# Patient Record
Sex: Female | Born: 2000 | Race: Black or African American | Hispanic: No | Marital: Single | Smoking: Never smoker
Health system: Southern US, Community
[De-identification: ages and names within clinical notes are randomized; demographics above are authoritative.]

## PROBLEM LIST (undated history)

## (undated) ENCOUNTER — Inpatient Hospital Stay: Payer: Self-pay

## (undated) DIAGNOSIS — F329 Major depressive disorder, single episode, unspecified: Secondary | ICD-10-CM

## (undated) DIAGNOSIS — D573 Sickle-cell trait: Secondary | ICD-10-CM

## (undated) DIAGNOSIS — Z789 Other specified health status: Secondary | ICD-10-CM

## (undated) DIAGNOSIS — F32A Depression, unspecified: Secondary | ICD-10-CM

## (undated) HISTORY — DX: Sickle-cell trait: D57.3

## (undated) HISTORY — DX: Depression, unspecified: F32.A

---
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
## 2018-04-20 ENCOUNTER — Emergency Department: Payer: Medicaid Other

## 2018-04-20 ENCOUNTER — Other Ambulatory Visit: Payer: Self-pay

## 2018-04-20 ENCOUNTER — Encounter: Payer: Self-pay | Admitting: Emergency Medicine

## 2018-04-20 ENCOUNTER — Emergency Department
Admission: EM | Admit: 2018-04-20 | Discharge: 2018-04-20 | Disposition: A | Discharge status: Home / Self Care | Payer: Medicaid Other | Attending: Emergency Medicine | Admitting: Emergency Medicine

## 2018-04-20 DIAGNOSIS — M545 Low back pain: Secondary | ICD-10-CM | POA: Diagnosis present

## 2018-04-20 DIAGNOSIS — M5442 Lumbago with sciatica, left side: Secondary | ICD-10-CM | POA: Diagnosis not present

## 2018-04-20 DIAGNOSIS — Z79899 Other long term (current) drug therapy: Secondary | ICD-10-CM | POA: Diagnosis not present

## 2018-04-20 LAB — URINALYSIS, COMPLETE (UACMP) WITH MICROSCOPIC
BILIRUBIN URINE: NEGATIVE
Bacteria, UA: NONE SEEN
GLUCOSE, UA: NEGATIVE mg/dL
Hgb urine dipstick: NEGATIVE
KETONES UR: NEGATIVE mg/dL
NITRITE: NEGATIVE
PH: 6 (ref 5.0–8.0)
Protein, ur: NEGATIVE mg/dL
SPECIFIC GRAVITY, URINE: 1.011 (ref 1.005–1.030)

## 2018-04-20 LAB — POCT PREGNANCY, URINE: Preg Test, Ur: NEGATIVE

## 2018-04-20 MED ORDER — BACLOFEN 10 MG PO TABS: 10.0000 mg | ORAL_TABLET | Freq: Three times a day (TID) | ORAL | 1 refills | Status: AC

## 2018-04-20 MED ORDER — MELOXICAM 15 MG PO TABS: 15.0000 mg | ORAL_TABLET | Freq: Every day | ORAL | 2 refills | Status: DC

## 2018-04-20 NOTE — Discharge Instructions (Addendum)
Follow-up with your regular doctor or Dr. Martha Clan if not better in 3 to 5 days.  Return emergency department worsening.  Use medications as prescribed.  Apply ice to the lower back to decrease inflammation.

## 2018-04-20 NOTE — ED Triage Notes (Signed)
Lower back pain x2 weeks radiating to left buttock/ left leg.

## 2018-04-20 NOTE — ED Notes (Signed)
See triage note  Presents with lower back pain   States pain started about 2 weeks ago w/o known injury  Then states she fell down steps yesterday  Having pain to lower back and pain is moving into left leg

## 2018-04-20 NOTE — ED Notes (Signed)
First Nurse Note: Patient complaining of back pain with numbness into left leg X 2 weeks.

## 2018-04-20 NOTE — ED Provider Notes (Signed)
[facility] Emergency Department Provider Note  ____________________________________________   First MD Initiated Contact with Patient 04/20/18 1153     (approximate)  I have reviewed the triage vital signs and the nursing notes.   HISTORY  Chief Complaint Back Pain    HPI Abigail Cortez is a 18 y.o. female  C/o low back pain for 14 day, possible known injury, patient states that she did a lot of bending and lifting due to her daughter's birthday party, pain is worse with movement, increased with bending over, denies numbness, tingling, or changes in bowel/urinary habits,  Using otc meds without relief Remainder ros neg, she is also worried about a's swollen area at the lower part of her back.   Past Medical History:  Diagnosis Date  . Medical history non-contributory   . Sickle-cell trait [facility])     Patient Active Problem List   Diagnosis Date Noted  . Pyelonephritis, acute 11/23/2017  . Uterine contractions during pregnancy 04/11/2017  . Irregular uterine contractions 03/07/2017  . Labor and delivery, indication for care 07/19/2015  . Indication for care in labor and delivery, antepartum 05/03/2015    History reviewed. No pertinent surgical history.  Prior to Admission medications   Medication Sig Start Date End Date Taking? Authorizing Provider  baclofen (LIORESAL) 10 MG tablet Take 1 tablet (10 mg total) by mouth 3 (three) times daily. 04/20/18 04/20/19  France Noyce, Roselyn Bering, PA-C  meloxicam (MOBIC) 15 MG tablet Take 1 tablet (15 mg total) by mouth daily. 04/20/18 04/20/19  Sherrie Mustache Roselyn Bering, PA-C  sertraline (ZOLOFT) 25 MG tablet Take 25 mg by mouth daily.    [provider]    Allergies Patient has no known allergies.  No family history on file.  Social History Social History   Tobacco Use  . Smoking status: Never Smoker  . Smokeless tobacco: Never Used  Substance Use Topics  . Alcohol use: No  . Drug use: No    Review of  Systems  Constitutional: No fever/chills Eyes: No visual changes. ENT: No sore throat. Respiratory: Denies cough Genitourinary: Negative for dysuria. Musculoskeletal: Positive for back pain. Skin: Negative for rash.    ____________________________________________   PHYSICAL EXAM:  VITAL SIGNS: ED Triage Vitals [04/20/18 1033]  Enc Vitals Group     BP 114/66     Pulse Rate 98     Resp 18     Temp 98.3 F (36.8 C)     Temp Source Oral     SpO2 99 %     Weight 246 lb (111.6 kg)     Height 5\' 3"  (1.6 m)     Head Circumference      Peak Flow      Pain Score 10     Pain Loc      Pain Edu?      Excl. in GC?     Constitutional: Alert and oriented. Well appearing and in no acute distress. Eyes: Conjunctivae are normal.  Head: Atraumatic. Nose: No congestion/rhinnorhea. Mouth/Throat: Mucous membranes are moist.   Neck:  supple no lymphadenopathy noted Cardiovascular: Normal rate, regular rhythm. Heart sounds are normal Respiratory: Normal respiratory effort.  No retractions, lungs c t a  GU: deferred Musculoskeletal: FROM all extremities, warm and well perfused.  Decreased rom of back due to discomfort, lumbar spine mildly tender, a soft tissue light lipoma is noted at the base of the lumbar spine, negative Slr, full strength in great toes b/l, full strength in lower  legs, n/v intact, patient is able to walk without difficulty Neurologic:  Normal speech and language.  Skin:  Skin is warm, dry and intact. No rash noted. Psychiatric: Mood and affect are normal. Speech and behavior are normal.  ____________________________________________   LABS (all labs ordered are listed, but only abnormal results are displayed)  Labs Reviewed  URINALYSIS, COMPLETE (UACMP) WITH MICROSCOPIC - Abnormal; Notable for the following components:      Result Value   Color, Urine YELLOW (*)    APPearance CLEAR (*)    Leukocytes,Ua TRACE (*)    All other components within normal limits  POC  URINE PREG, ED  POCT PREGNANCY, URINE   ____________________________________________   ____________________________________________  RADIOLOGY  X-ray lumbar spine is negative for any acute abnormality  ____________________________________________   PROCEDURES  Procedure(s) performed: No  Procedures    ____________________________________________   INITIAL IMPRESSION / ASSESSMENT AND PLAN / ED COURSE  Pertinent labs & imaging results that were available during my care of the patient were reviewed by me and considered in my medical decision making (see chart for details).   Patient is 18 year old female presents emergency department complaint of low back pain.  Physical exam shows some soft tissue swelling noted at the lumbar spine, area is tender to palpation  X-ray of the lumbar spine is negative  Explained the x-ray results to the patient.  She is to follow-up with regular doctor if not better in 3 to 5 days.  She was given prescription for meloxicam and baclofen.  She is apply ice to the lower back.  She is to return if worsening.  She states she understands will comply.  She is discharged stable condition.     As part of my medical decision making, I reviewed the following data within the electronic MEDICAL RECORD NUMBER Nursing notes reviewed and incorporated, Old chart reviewed, Radiograph reviewed x-ray lumbar spine is negative, Notes from prior ED visits and [place] Controlled Substance Database  ____________________________________________   FINAL CLINICAL IMPRESSION(S) / ED DIAGNOSES  Final diagnoses:  Acute midline low back pain with left-sided sciatica      NEW MEDICATIONS STARTED DURING THIS VISIT:  New Prescriptions   BACLOFEN (LIORESAL) 10 MG TABLET    Take 1 tablet (10 mg total) by mouth 3 (three) times daily.   MELOXICAM (MOBIC) 15 MG TABLET    Take 1 tablet (15 mg total) by mouth daily.     Note:  This document was prepared using Dragon voice  recognition software and may include unintentional dictation errors.     Faythe Ghee, PA-C 04/20/18 1514    Rockne Menghini, MD 04/20/18 [phone]

## 2018-07-17 IMAGING — CT CT MAXILLOFACIAL W/O CM
4 of 8 series · 16 of 47 positions shown, 19 images · non-contrast
Comparison: None.

CLINICAL DATA: Patient fell down steps 2 days ago presents with
headache. Tenderness to left side of face.

EXAM:
CT HEAD WITHOUT CONTRAST
CT MAXILLOFACIAL WITHOUT CONTRAST
TECHNIQUE: Multidetector CT imaging of the head and maxillofacial structures
were performed using the standard protocol without intravenous
contrast. Multiplanar CT image reconstructions of the maxillofacial
structures were also generated.

[Series 3: head bone · axial · 0.41mm/px · z∈[+202,+314]mm · 10 of 70 slices shown, 13 images]
[im 7/70  brain]
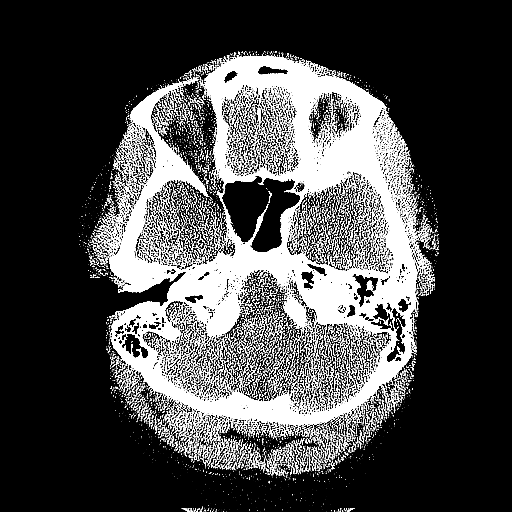
[im 7/70  bone]
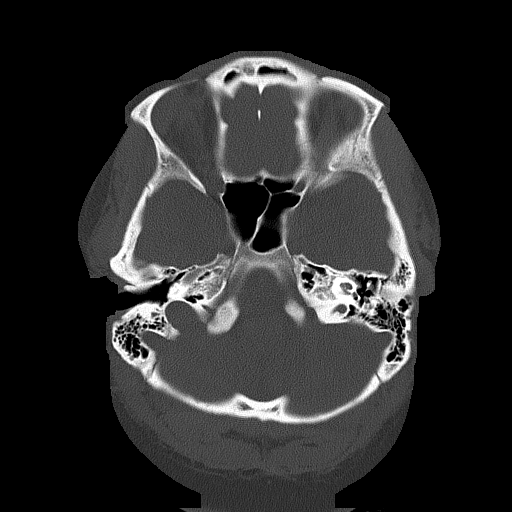
[im 13/70  bone]
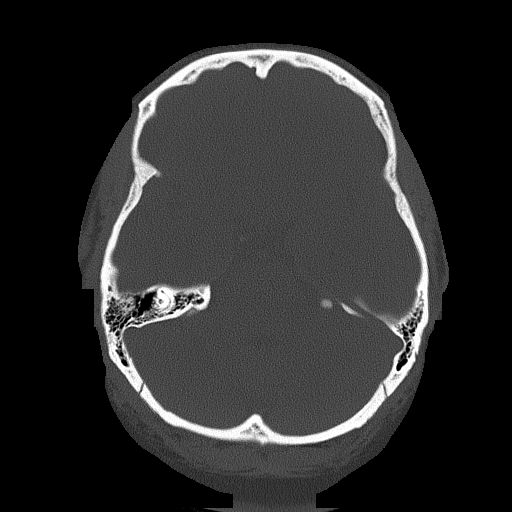
[im 19/70  bone]
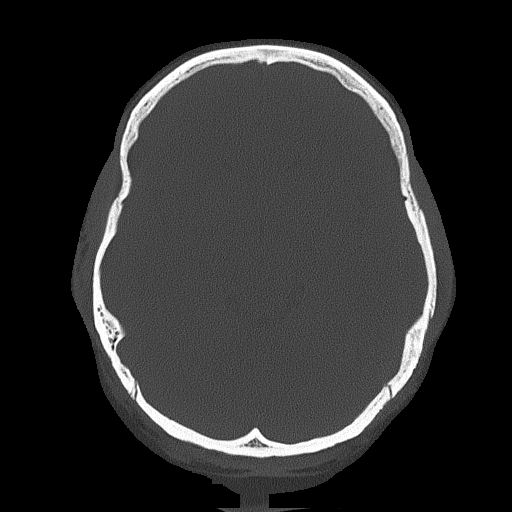
[im 26/70  bone]
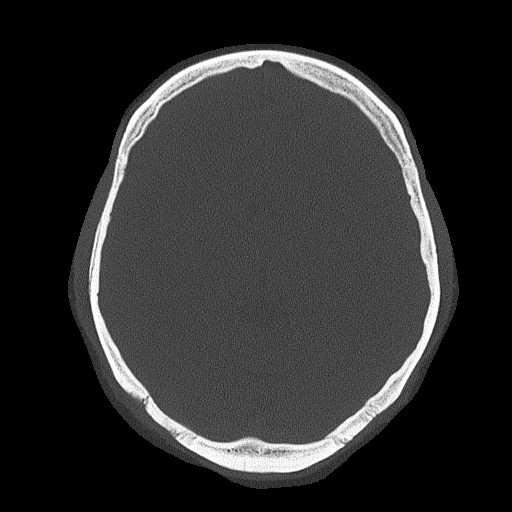
[im 32/70  brain]
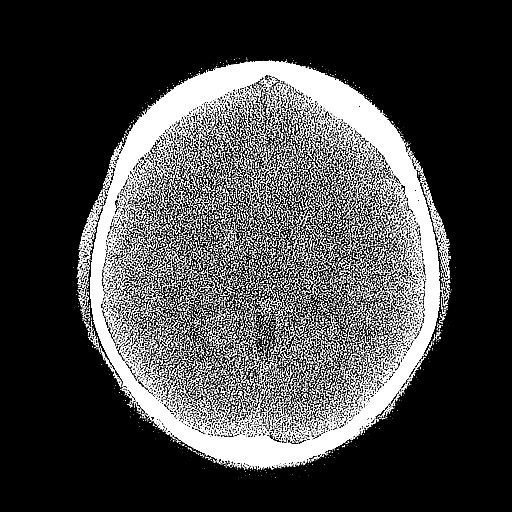
[im 32/70  bone]
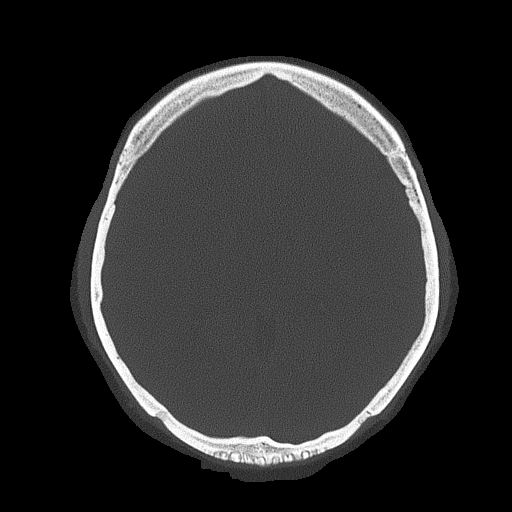
[im 38/70  bone]
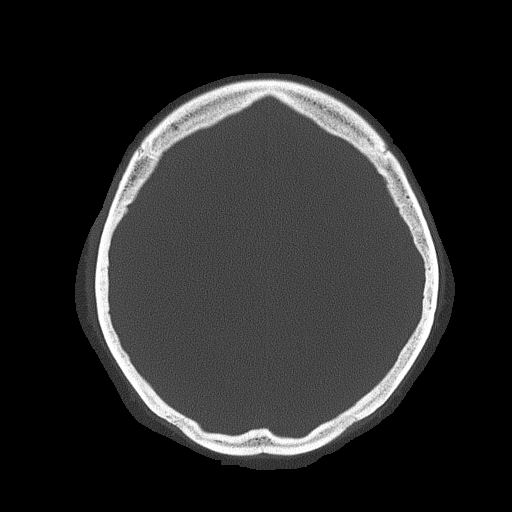
[im 44/70  bone]
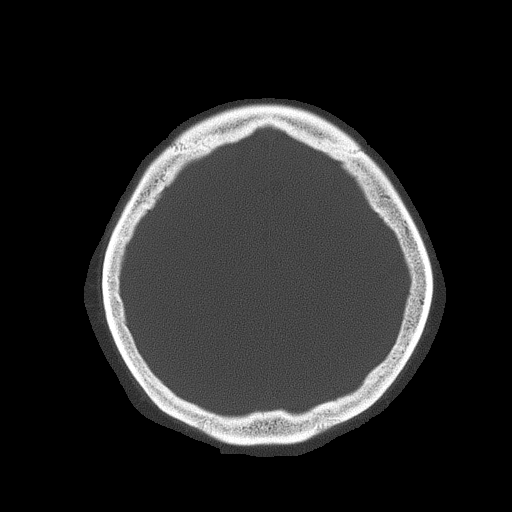
[im 51/70  bone]
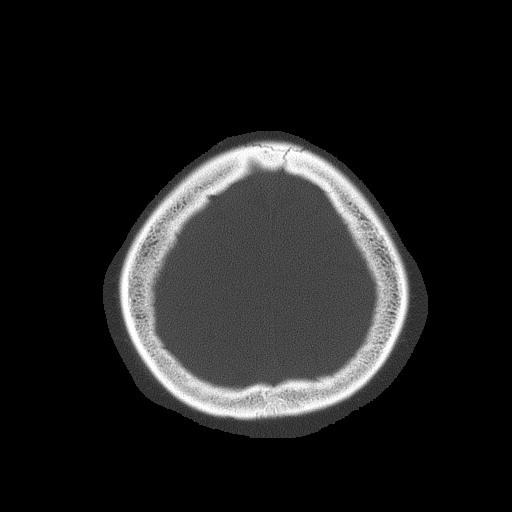
[im 57/70  brain]
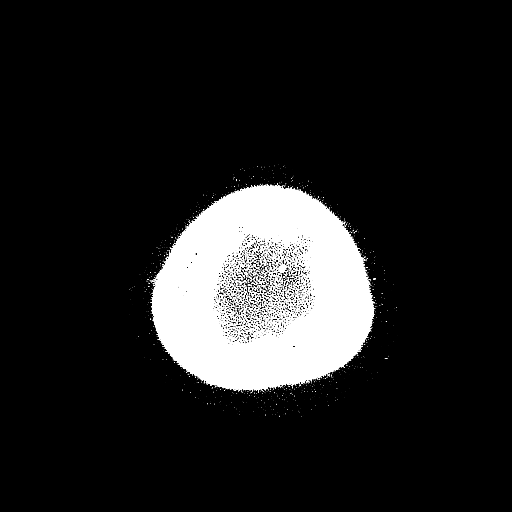
[im 57/70  bone]
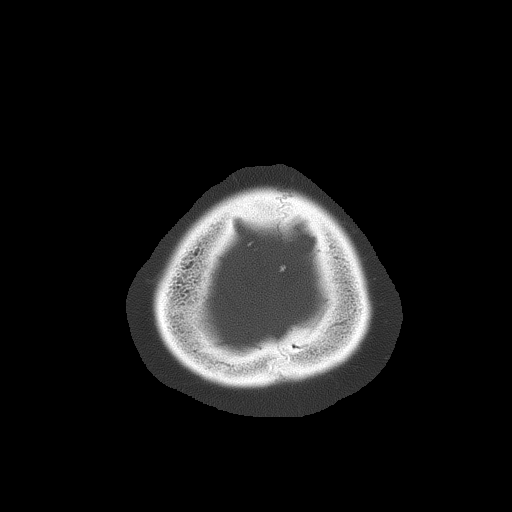
[im 63/70  bone]
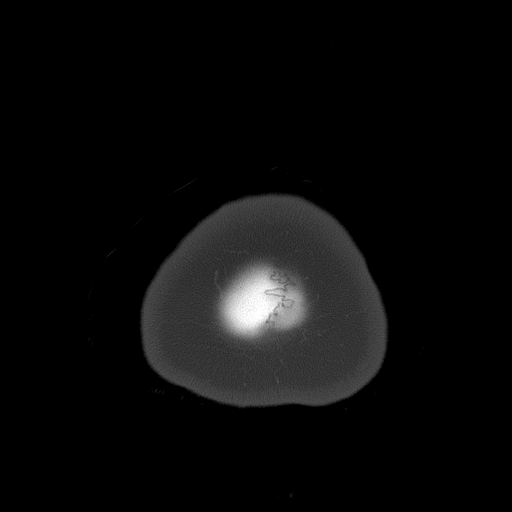

[Series 6: max soft · axial · 0.35mm/px · z∈[+92,+116]mm · 2 of 73 slices shown]
[im 7/73  brain]
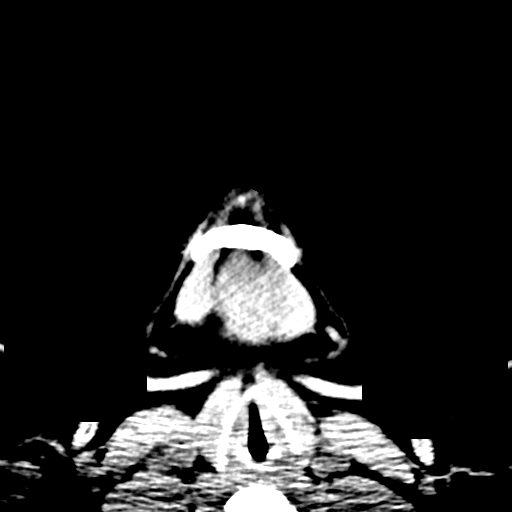
[im 19/73  brain]
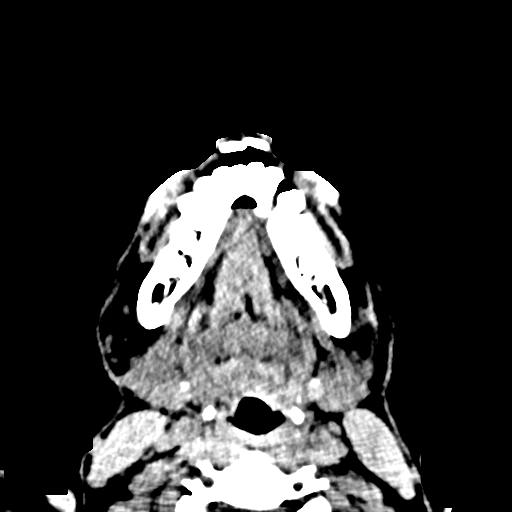

[Series 10: coronal soft · coronal · 0.35mm/px · 3 of 75 slices shown]
[im 21/75  bone]
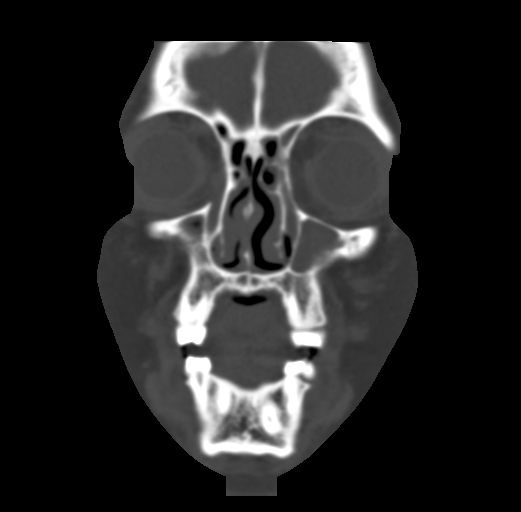
[im 34/75  bone]
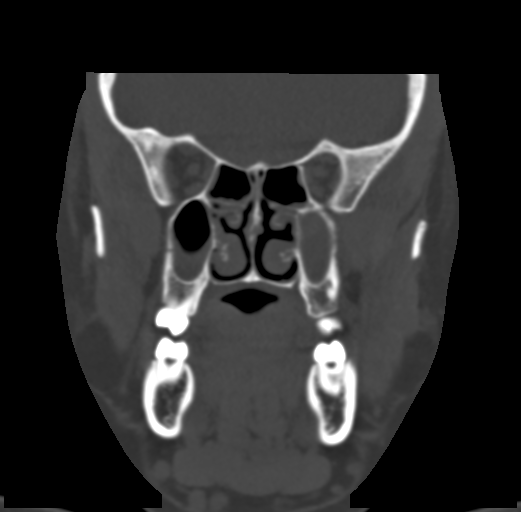
[im 48/75  bone]
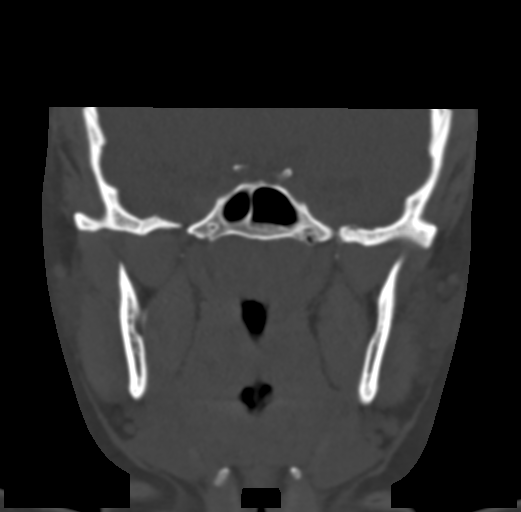

[Series 13: sagittal bone · sagittal · 0.35mm/px · 1 of 78 slices shown]
[im 39/78  bone]
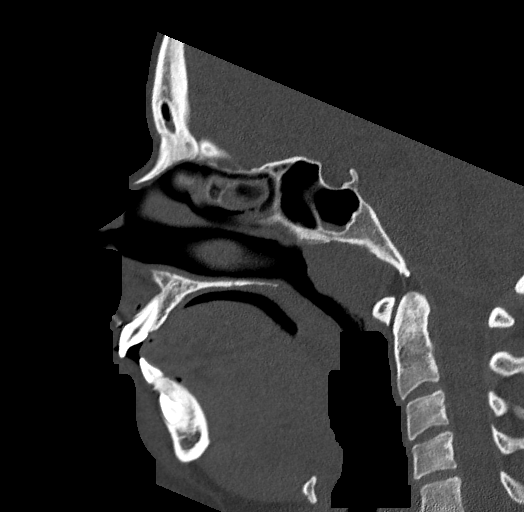

[16 of 47 positions shown; findings below may reference images not displayed]

FINDINGS: CT HEAD FINDINGS

BRAIN: The ventricles and sulci are normal. No intraparenchymal
hemorrhage, mass effect nor midline shift. No acute large vascular
territory infarcts. No abnormal extra-axial fluid collections. Basal
cisterns are patent.

VASCULAR: Unremarkable.

SKULL/SOFT TISSUES: No skull fracture. No significant soft tissue
swelling.

OTHER: None.

CT MAXILLOFACIAL FINDINGS

OSSEOUS: The mandible is intact, the condyles are located. No acute
facial fracture. No destructive bony lesions.

ORBITS: Ocular globes and orbital contents are normal.

SINUSES: Moderate paranasal sinus mucosal thickening of the included
maxillary sinuses and mild-to-moderate ethmoid sinus mucosal
thickening. Mild frontal sinus mucosal thickening is also noted. The
sphenoid sinus is clear. No air-fluid levels are noted. Nasal septum
is midline. Included mastoid air cells are well aerated.

SOFT TISSUES: No significant soft tissue swelling. No subcutaneous
gas or radiopaque foreign bodies.
IMPRESSION: 1. Normal CT head.
2. No acute maxillofacial fracture.  Chronic paranasal sinusitis.

## 2018-09-28 ENCOUNTER — Ambulatory Visit: Payer: Medicaid Other

## 2018-10-18 ENCOUNTER — Other Ambulatory Visit: Payer: Self-pay

## 2018-10-18 ENCOUNTER — Emergency Department
Admission: EM | Admit: 2018-10-18 | Discharge: 2018-10-18 | Disposition: A | Discharge status: Home / Self Care | Payer: Medicaid Other | Attending: Student in an Organized Health Care Education/Training Program | Admitting: Student in an Organized Health Care Education/Training Program

## 2018-10-18 ENCOUNTER — Emergency Department: Payer: Medicaid Other

## 2018-10-18 DIAGNOSIS — Z20828 Contact with and (suspected) exposure to other viral communicable diseases: Secondary | ICD-10-CM | POA: Insufficient documentation

## 2018-10-18 DIAGNOSIS — B349 Viral infection, unspecified: Secondary | ICD-10-CM | POA: Diagnosis not present

## 2018-10-18 DIAGNOSIS — J029 Acute pharyngitis, unspecified: Secondary | ICD-10-CM

## 2018-10-18 DIAGNOSIS — Z79899 Other long term (current) drug therapy: Secondary | ICD-10-CM | POA: Diagnosis not present

## 2018-10-18 LAB — BASIC METABOLIC PANEL
Anion gap: 9 (ref 5–15)
BUN: 10 mg/dL (ref 6–20)
CO2: 23 mmol/L (ref 22–32)
Calcium: 9 mg/dL (ref 8.9–10.3)
Chloride: 106 mmol/L (ref 98–111)
Creatinine, Ser: 0.85 mg/dL (ref 0.44–1.00)
GFR calc Af Amer: >60 mL/min (ref >60)
GFR calc non Af Amer: >60 mL/min (ref >60)
Glucose, Bld: 103 mg/dL — ABNORMAL HIGH (ref 70–99)
Potassium: 3.5 mmol/L (ref 3.5–5.1)
Sodium: 138 mmol/L (ref 135–145)

## 2018-10-18 LAB — CBC WITH DIFFERENTIAL/PLATELET
Abs Immature Granulocytes: 0.02 10*3/uL (ref 0.00–0.07)
Basophils Absolute: 0 10*3/uL (ref 0.0–0.1)
Basophils Relative: 0 %
Eosinophils Absolute: 0.2 10*3/uL (ref 0.0–0.5)
Eosinophils Relative: 3 %
HCT: 37.4 % (ref 36.0–46.0)
Hemoglobin: 12.4 g/dL (ref 12.0–15.0)
Immature Granulocytes: 0 %
Lymphocytes Relative: 30 %
Lymphs Abs: 2.1 10*3/uL (ref 0.7–4.0)
MCH: 25.6 pg — ABNORMAL LOW (ref 26.0–34.0)
MCHC: 33.2 g/dL (ref 30.0–36.0)
MCV: 77.1 fL — ABNORMAL LOW (ref 80.0–100.0)
Monocytes Absolute: 0.6 10*3/uL (ref 0.1–1.0)
Monocytes Relative: 9 %
Neutro Abs: 3.9 10*3/uL (ref 1.7–7.7)
Neutrophils Relative %: 58 %
Platelets: 316 10*3/uL (ref 150–400)
RBC: 4.85 MIL/uL (ref 3.87–5.11)
RDW: 15 % (ref 11.5–15.5)
WBC: 6.8 10*3/uL (ref 4.0–10.5)
nRBC: 0 % (ref 0.0–0.2)

## 2018-10-18 LAB — GROUP A STREP BY PCR: Group A Strep by PCR: NOT DETECTED

## 2018-10-18 LAB — SARS CORONAVIRUS 2 (TAT 6-24 HRS): SARS Coronavirus 2: NEGATIVE

## 2018-10-18 MED ORDER — METHYLPREDNISOLONE 4 MG PO TBPK: ORAL_TABLET | ORAL | 0 refills | Status: DC

## 2018-10-18 MED ORDER — KETOROLAC TROMETHAMINE 30 MG/ML IJ SOLN
30.0000 mg | Freq: Once | INTRAMUSCULAR | Status: AC
  Administered 2018-10-18: 09:00:00 30 mg via INTRAVENOUS
  Filled 2018-10-18: qty 1

## 2018-10-18 MED ORDER — METHYLPREDNISOLONE SODIUM SUCC 125 MG IJ SOLR
125.0000 mg | Freq: Once | INTRAMUSCULAR | Status: AC
  Administered 2018-10-18: 09:00:00 125 mg via INTRAVENOUS
  Filled 2018-10-18: qty 2

## 2018-10-18 MED ORDER — LIDOCAINE VISCOUS HCL 2 % MT SOLN: 5.0000 mL | Freq: Four times a day (QID) | OROMUCOSAL | 0 refills | Status: DC | PRN

## 2018-10-18 NOTE — ED Notes (Signed)
Pt provided with phone to call POC.

## 2018-10-18 NOTE — ED Notes (Signed)
Patient transported to X-ray 

## 2018-10-18 NOTE — ED Provider Notes (Signed)
[facility] Emergency Department Provider Note   ____________________________________________   First MD Initiated Contact with Patient 10/18/18 [phone]     (approximate)  I have reviewed the triage vital signs and the nursing notes.   HISTORY  Chief Complaint Dental Pain, Sore Throat, and Generalized Body Aches    HPI Abigail Cortez is a 18 y.o. female patient presents with acute onset of sore throat, mouth pain, and body aches.  Patient states unable to open mouth secondary to swelling.  Onset of complaint was 1 day ago.  Patient denies provocative incident for complaint.  Patient denies fever/chills, nasal congestion, or headache.  Patient rates the pain as a 10/10.  No palliative measure for complaint.         Past Medical History:  Diagnosis Date  . Depression   . Medical history non-contributory   . Sickle-cell trait [facility])     Patient Active Problem List   Diagnosis Date Noted  . Pyelonephritis, acute 11/23/2017  . Irregular uterine contractions 03/07/2017    No past surgical history on file.  Prior to Admission medications   Medication Sig Start Date End Date Taking? Authorizing Provider  baclofen (LIORESAL) 10 MG tablet Take 1 tablet (10 mg total) by mouth 3 (three) times daily. 04/20/18 04/20/19  Fisher, Linden Dolin, PA-C  lidocaine (XYLOCAINE) 2 % solution Use as directed 5 mLs in the mouth or throat every 6 (six) hours as needed for mouth pain. Swish and swallow. 10/18/18   Sable Feil, PA-C  meloxicam (MOBIC) 15 MG tablet Take 1 tablet (15 mg total) by mouth daily. 04/20/18 04/20/19  Caryn Section Linden Dolin, PA-C  methylPREDNISolone (MEDROL DOSEPAK) 4 MG TBPK tablet Take Tapered dose as directed 10/18/18   Sable Feil, PA-C  sertraline (ZOLOFT) 25 MG tablet Take 25 mg by mouth daily.    [provider]    Allergies Patient has no known allergies.  Family History  Problem Relation Age of Onset  . Anemia Mother   . Asthma Mother    . Seizures Mother   . Bipolar disorder Mother   . Hypertension Mother   . Hepatitis Mother   . Hypertension Maternal Grandmother   . Diabetes Maternal Grandmother   . Breast cancer Maternal Grandmother   . Bipolar disorder Maternal Grandmother   . Schizophrenia Maternal Grandmother   . Hypertension Maternal Grandfather   . Hypertension Paternal Grandmother   . Lung cancer Paternal Grandmother   . Diabetes Paternal Grandmother   . Migraines Paternal Grandmother   . Hypertension Paternal Grandfather     Social History Social History   Tobacco Use  . Smoking status: Never Smoker  . Smokeless tobacco: Never Used  Substance Use Topics  . Alcohol use: No  . Drug use: No    Review of Systems Constitutional: No fever/chills.  Myalgia Eyes: No visual changes. ENT: Sore throat. Cardiovascular: Denies chest pain. Respiratory: Denies shortness of breath. Gastrointestinal: No abdominal pain.  No nausea, no vomiting.  No diarrhea.  No constipation. Genitourinary: Negative for dysuria. Musculoskeletal: Negative for back pain. Skin: Negative for rash.  Mild facial edema Neurological: Negative for headaches, focal weakness or numbness. Hematological/Lymphatic:  Sickle cell trait  ____________________________________________   PHYSICAL EXAM:  VITAL SIGNS: ED Triage Vitals  Enc Vitals Group     BP 10/18/18 0814 128/74     Pulse Rate 10/18/18 0814 (!) 101     Resp 10/18/18 0814 20     Temp 10/18/18 0814 98.4  F (36.9 C)     Temp Source 10/18/18 0814 Oral     SpO2 10/18/18 0814 97 %     Weight 10/18/18 0815 220 lb (99.8 kg)     Height 10/18/18 0815 5\' 2"  (1.575 m)     Head Circumference --      Peak Flow --      Pain Score 10/18/18 0815 10     Pain Loc --      Pain Edu? --      Excl. in GC? --    Constitutional: Alert and oriented. Well appearing and in no acute distress. Nose: No congestion/rhinnorhea. Mouth/Throat: Mucous membranes are moist.  Oropharynx  non-erythematous. Neck: No stridor.  Hematological/Lymphatic/Immunilogical: No cervical lymphadenopathy. Cardiovascular: Normal rate, regular rhythm. Grossly normal heart sounds.  Good peripheral circulation. Respiratory: Normal respiratory effort.  No retractions. Lungs CTAB. Gastrointestinal: Soft and nontender. No distention. No abdominal bruits. No CVA tenderness. Genitourinary: Deferred Musculoskeletal: No lower extremity tenderness nor edema.  No joint effusions. Neurologic:  Normal speech and language. No gross focal neurologic deficits are appreciated. No gait instability. Skin:  Skin is warm, dry and intact. No rash noted. Psychiatric: Mood and affect are normal. Speech and behavior are normal.  ____________________________________________   LABS (all labs ordered are listed, but only abnormal results are displayed)  Labs Reviewed  BASIC METABOLIC PANEL - Abnormal; Notable for the following components:      Result Value   Glucose, Bld 103 (*)    All other components within normal limits  CBC WITH DIFFERENTIAL/PLATELET - Abnormal; Notable for the following components:   MCV 77.1 (*)    MCH 25.6 (*)    All other components within normal limits  GROUP A STREP BY PCR  SARS CORONAVIRUS 2   ____________________________________________  EKG   ____________________________________________  RADIOLOGY  ED MD interpretation:    Official radiology report(s): Dg Neck Soft Tissue  Result Date: 10/18/2018 CLINICAL DATA:  Sore throat and pain when swallowing since yesterday, no fever EXAM: NECK SOFT TISSUES - 1+ VIEW COMPARISON:  None. FINDINGS: There is no evidence of retropharyngeal soft tissue swelling or epiglottic enlargement. The cervical airway is unremarkable and no radio-opaque foreign body identified. IMPRESSION: Negative. Electronically Signed   By: Norva PavlovElizabeth  Brown M.D.   On: 10/18/2018 09:12    ____________________________________________   PROCEDURES   Procedure(s) performed (including Critical Care):  Procedures   ____________________________________________   INITIAL IMPRESSION / ASSESSMENT AND PLAN / ED COURSE  As part of my medical decision making, I reviewed the following data within the electronic MEDICAL RECORD NUMBER     Abigail Cortez was evaluated in Emergency Department on 10/18/2018 for the symptoms described in the history of present illness. She was evaluated in the context of the global COVID-19 pandemic, which necessitated consideration that the patient might be at risk for infection with the SARS-CoV-2 virus that causes COVID-19. Institutional protocols and algorithms that pertain to the evaluation of patients at risk for COVID-19 are in a state of rapid change based on information released by regulatory bodies including the CDC and federal and state organizations. These policies and algorithms were followed during the patient's care in the ED.  Patient presents with cute onset of sore throat mild facial edema.  Patient also complain of body aches.  Discussed negative lab results with patient.  Discussed negative soft tissue neck x-ray.  Physical exam was grossly unremarkable.  Patient state feeling better status post IV steroids.  Advised patient self quarantine  pending COVID-19 results.  Patient given discharge care instruction advised return to ED if condition worsens.          ____________________________________________   FINAL CLINICAL IMPRESSION(S) / ED DIAGNOSES  Final diagnoses:  Viral illness  Sore throat     ED Discharge Orders         Ordered    methylPREDNISolone (MEDROL DOSEPAK) 4 MG TBPK tablet     10/18/18 0939    lidocaine (XYLOCAINE) 2 % solution  Every 6 hours PRN     10/18/18 [id number]           Note:  This document was prepared using Dragon voice recognition software and may include unintentional dictation errors.    Joni ReiningSmith, Ronald K, PA-C 10/18/18 [id number]    Willy Eddyobinson, Patrick, MD  10/18/18 [phone]

## 2018-10-18 NOTE — ED Triage Notes (Signed)
Pt arrived via POV with reports of "everything hurts" Pt states mouth pain and sore throat. Pt mumbling in triage.   Pt denies any COVID contacts.

## 2018-10-18 NOTE — Discharge Instructions (Signed)
Advised self quarantine pending COVID-19 test results. °

## 2018-10-18 NOTE — ED Notes (Signed)
ED Provider at bedside. 

## 2019-04-05 ENCOUNTER — Emergency Department: Payer: Medicaid Other

## 2019-04-05 ENCOUNTER — Encounter: Payer: Self-pay | Admitting: Emergency Medicine

## 2019-04-05 ENCOUNTER — Emergency Department
Admission: EM | Admit: 2019-04-05 | Discharge: 2019-04-05 | Disposition: A | Discharge status: Home / Self Care | Payer: Medicaid Other | Attending: Emergency Medicine | Admitting: Emergency Medicine

## 2019-04-05 ENCOUNTER — Other Ambulatory Visit: Payer: Self-pay

## 2019-04-05 DIAGNOSIS — N12 Tubulo-interstitial nephritis, not specified as acute or chronic: Secondary | ICD-10-CM | POA: Diagnosis not present

## 2019-04-05 DIAGNOSIS — D573 Sickle-cell trait: Secondary | ICD-10-CM | POA: Diagnosis not present

## 2019-04-05 DIAGNOSIS — E860 Dehydration: Secondary | ICD-10-CM | POA: Diagnosis not present

## 2019-04-05 DIAGNOSIS — R112 Nausea with vomiting, unspecified: Secondary | ICD-10-CM | POA: Diagnosis present

## 2019-04-05 DIAGNOSIS — Z20822 Contact with and (suspected) exposure to covid-19: Secondary | ICD-10-CM | POA: Diagnosis not present

## 2019-04-05 LAB — COMPREHENSIVE METABOLIC PANEL
ALT: 16 U/L (ref 0–44)
AST: 33 U/L (ref 15–41)
Albumin: 3.6 g/dL (ref 3.5–5.0)
Alkaline Phosphatase: 65 U/L (ref 38–126)
Anion gap: 12 (ref 5–15)
BUN: 8 mg/dL (ref 6–20)
CO2: 24 mmol/L (ref 22–32)
Calcium: 8.7 mg/dL — ABNORMAL LOW (ref 8.9–10.3)
Chloride: 100 mmol/L (ref 98–111)
Creatinine, Ser: 1.08 mg/dL — ABNORMAL HIGH (ref 0.44–1.00)
GFR calc Af Amer: >60 mL/min (ref >60)
GFR calc non Af Amer: >60 mL/min (ref >60)
Glucose, Bld: 111 mg/dL — ABNORMAL HIGH (ref 70–99)
Potassium: 3 mmol/L — ABNORMAL LOW (ref 3.5–5.1)
Sodium: 136 mmol/L (ref 135–145)
Total Bilirubin: 1.8 mg/dL — ABNORMAL HIGH (ref 0.3–1.2)
Total Protein: 8.1 g/dL (ref 6.5–8.1)

## 2019-04-05 LAB — CBC WITH DIFFERENTIAL/PLATELET
Abs Immature Granulocytes: 0.07 10*3/uL (ref 0.00–0.07)
Basophils Absolute: 0 10*3/uL (ref 0.0–0.1)
Basophils Relative: 0 %
Eosinophils Absolute: 0 10*3/uL (ref 0.0–0.5)
Eosinophils Relative: 0 %
HCT: 35.6 % — ABNORMAL LOW (ref 36.0–46.0)
Hemoglobin: 11.9 g/dL — ABNORMAL LOW (ref 12.0–15.0)
Immature Granulocytes: 0 %
Lymphocytes Relative: 9 %
Lymphs Abs: 1.4 10*3/uL (ref 0.7–4.0)
MCH: 26.3 pg (ref 26.0–34.0)
MCHC: 33.4 g/dL (ref 30.0–36.0)
MCV: 78.8 fL — ABNORMAL LOW (ref 80.0–100.0)
Monocytes Absolute: 1.7 10*3/uL — ABNORMAL HIGH (ref 0.1–1.0)
Monocytes Relative: 11 %
Neutro Abs: 13 10*3/uL — ABNORMAL HIGH (ref 1.7–7.7)
Neutrophils Relative %: 80 %
Platelets: 231 10*3/uL (ref 150–400)
RBC: 4.52 MIL/uL (ref 3.87–5.11)
RDW: 13.7 % (ref 11.5–15.5)
WBC: 16.2 10*3/uL — ABNORMAL HIGH (ref 4.0–10.5)
nRBC: 0 % (ref 0.0–0.2)

## 2019-04-05 LAB — LACTIC ACID, PLASMA: Lactic Acid, Venous: 0.8 mmol/L (ref 0.5–1.9)

## 2019-04-05 LAB — URINALYSIS, COMPLETE (UACMP) WITH MICROSCOPIC
Bilirubin Urine: NEGATIVE
Glucose, UA: NEGATIVE mg/dL
Ketones, ur: NEGATIVE mg/dL
Nitrite: NEGATIVE
Protein, ur: 100 mg/dL — AB
Specific Gravity, Urine: 1.012 (ref 1.005–1.030)
WBC, UA: >50 WBC/hpf — ABNORMAL HIGH (ref 0–5)
pH: 6 (ref 5.0–8.0)

## 2019-04-05 LAB — RESPIRATORY PANEL BY RT PCR (FLU A&B, COVID)
Influenza A by PCR: NEGATIVE
Influenza B by PCR: NEGATIVE
SARS Coronavirus 2 by RT PCR: NEGATIVE

## 2019-04-05 LAB — LIPASE, BLOOD: Lipase: 20 U/L (ref 11–51)

## 2019-04-05 MED ORDER — SODIUM CHLORIDE 0.9 % IV SOLN
1.0000 g | Freq: Once | INTRAVENOUS | Status: AC
  Administered 2019-04-05: 1 g via INTRAVENOUS
  Filled 2019-04-05: qty 10

## 2019-04-05 MED ORDER — CIPROFLOXACIN HCL 500 MG PO TABS: 500.0000 mg | ORAL_TABLET | Freq: Two times a day (BID) | ORAL | 0 refills | Status: AC

## 2019-04-05 MED ORDER — ONDANSETRON 4 MG PO TBDP: 4.0000 mg | ORAL_TABLET | Freq: Three times a day (TID) | ORAL | 0 refills | Status: DC | PRN

## 2019-04-05 MED ORDER — ACETAMINOPHEN 500 MG PO TABS: 1000.0000 mg | ORAL_TABLET | Freq: Once | ORAL | Status: DC

## 2019-04-05 MED ORDER — ONDANSETRON HCL 4 MG/2ML IJ SOLN
4.0000 mg | Freq: Once | INTRAMUSCULAR | Status: AC
  Administered 2019-04-05: 20:00:00 4 mg via INTRAVENOUS
  Filled 2019-04-05: qty 2

## 2019-04-05 MED ORDER — SODIUM CHLORIDE 0.9% FLUSH
3.0000 mL | Freq: Once | INTRAVENOUS | Status: AC
  Administered 2019-04-05: 20:00:00 3 mL via INTRAVENOUS

## 2019-04-05 MED ORDER — SODIUM CHLORIDE 0.9 % IV SOLN: Freq: Once | INTRAVENOUS | Status: AC

## 2019-04-05 MED ORDER — ACETAMINOPHEN 500 MG PO TABS
1000.0000 mg | ORAL_TABLET | Freq: Once | ORAL | Status: AC
  Administered 2019-04-05: 1000 mg via ORAL
  Filled 2019-04-05: qty 2

## 2019-04-05 NOTE — ED Triage Notes (Signed)
Pt to ED from home c/o nausea and vomiting since Saturday, states vomited x2 today, denies diarrhea.  States got COVID results back Friday that were negative.  States generalized abd pain after throwing up.  States generally feels weak and light headed.  Pt A&Ox4, chest rise even and unlabored in triage.

## 2019-04-05 NOTE — ED Provider Notes (Addendum)
[facility] Emergency Department Provider Note       Time seen: ----------------------------------------- 7:46 PM on 04/05/2019 -----------------------------------------   I have reviewed the triage vital signs and the nursing notes.  HISTORY   Chief Complaint Nausea and Emesis    HPI Abigail Cortez is a 19 y.o. female with a history of depression, sickle cell trait who presents to the ED for nausea vomiting since Saturday.  Patient vomited twice today, denies any diarrhea.  Patient states she got Covid results back Friday and were negative.  She still has her taste and smell, has generalized abdominal pain after throwing up.  Feels weak and lightheaded.  Past Medical History:  Diagnosis Date  . Depression   . Medical history non-contributory   . Sickle-cell trait [facility])     Patient Active Problem List   Diagnosis Date Noted  . Pyelonephritis, acute 11/23/2017  . Irregular uterine contractions 03/07/2017    History reviewed. No pertinent surgical history.  Allergies Patient has no known allergies.  Social History Social History   Tobacco Use  . Smoking status: Never Smoker  . Smokeless tobacco: Never Used  Substance Use Topics  . Alcohol use: Yes  . Drug use: Yes    Types: Marijuana    Review of Systems Constitutional: Negative for fever. Cardiovascular: Negative for chest pain. Respiratory: Negative for shortness of breath. Gastrointestinal: Positive for abdominal pain, vomiting Musculoskeletal: Negative for back pain. Skin: Negative for rash. Neurological: Negative for headaches, focal weakness or numbness.  All systems negative/normal/unremarkable except as stated in the HPI  ____________________________________________   PHYSICAL EXAM:  VITAL SIGNS: ED Triage Vitals  Enc Vitals Group     BP 04/05/19 1912 119/82     Pulse Rate 04/05/19 1912 (!) 131     Resp 04/05/19 1912 20     Temp 04/05/19 1912 (!) 103 F (39.4 C)      Temp Source 04/05/19 1912 Oral     SpO2 04/05/19 1912 99 %     Weight 04/05/19 1917 235 lb (106.6 kg)     Height 04/05/19 1917 5\' 3"  (1.6 m)     Head Circumference --      Peak Flow --      Pain Score 04/05/19 1916 9     Pain Loc --      Pain Edu? --      Excl. in [place]? --     Constitutional: Alert and oriented. Well appearing and in no distress. Eyes: Conjunctivae are normal. Normal extraocular movements. Cardiovascular: Rapid rate, regular rhythm. No murmurs, rubs, or gallops. Respiratory: Normal respiratory effort without tachypnea nor retractions. Breath sounds are clear and equal bilaterally. No wheezes/rales/rhonchi. Gastrointestinal: Soft and nontender. Normal bowel sounds Musculoskeletal: Nontender with normal range of motion in extremities. No lower extremity tenderness nor edema. Neurologic:  Normal speech and language. No gross focal neurologic deficits are appreciated.  Skin:  Skin is warm, dry and intact. No rash noted. Psychiatric: Mood and affect are normal. Speech and behavior are normal.  ____________________________________________  EKG: Interpreted by me.  Sinus tachycardia with a rate of 133 bpm, normal PR interval, normal QRS, normal QT  ____________________________________________  ED COURSE:  As part of my medical decision making, I reviewed the following data within the [place] History obtained from family if available, nursing notes, old chart and ekg, as well as notes from prior ED visits. Patient presented for abdominal pain and vomiting, we will assess with labs and imaging  as indicated at this time.   Procedures  Cree Napoli was evaluated in Emergency Department on 04/05/2019 for the symptoms described in the history of present illness. She was evaluated in the context of the global COVID-19 pandemic, which necessitated consideration that the patient might be at risk for infection with the SARS-CoV-2 virus that causes COVID-19.  Institutional protocols and algorithms that pertain to the evaluation of patients at risk for COVID-19 are in a state of rapid change based on information released by regulatory bodies including the CDC and federal and state organizations. These policies and algorithms were followed during the patient's care in the ED.  ____________________________________________   LABS (pertinent positives/negatives)  Labs Reviewed  COMPREHENSIVE METABOLIC PANEL - Abnormal; Notable for the following components:      Result Value   Potassium 3.0 (*)    Glucose, Bld 111 (*)    Creatinine, Ser 1.08 (*)    Calcium 8.7 (*)    Total Bilirubin 1.8 (*)    All other components within normal limits  CBC WITH DIFFERENTIAL/PLATELET - Abnormal; Notable for the following components:   WBC 16.2 (*)    Hemoglobin 11.9 (*)    HCT 35.6 (*)    MCV 78.8 (*)    Neutro Abs 13.0 (*)    Monocytes Absolute 1.7 (*)    All other components within normal limits  URINALYSIS, COMPLETE (UACMP) WITH MICROSCOPIC - Abnormal; Notable for the following components:   Color, Urine AMBER (*)    APPearance CLOUDY (*)    Hgb urine dipstick SMALL (*)    Protein, ur 100 (*)    Leukocytes,Ua LARGE (*)    WBC, UA >50 (*)    Bacteria, UA FEW (*)    Non Squamous Epithelial PRESENT (*)    All other components within normal limits  RESPIRATORY PANEL BY RT PCR (FLU A&B, COVID)  URINE CULTURE  LACTIC ACID, PLASMA  LIPASE, BLOOD  POC URINE PREG, ED   ____________________________________________   DIFFERENTIAL DIAGNOSIS   Gastroenteritis, dehydration, electrolyte abnormality, DKA, COVID-19  FINAL ASSESSMENT AND PLAN  Vomiting, dehydration, pyelonephritis   Plan: The patient had presented for nausea vomiting since Saturday. Patient's labs indicated significant leukocytosis and urinary tract infection.  She is not having any active back pain but has had some recently.  Does not have any abdominal pain.  Clinically likely with  pyelonephritis given her high fever with vomiting and recent back pain.  She has received IV fluids as well as IV Rocephin.  We have sent a urine culture.  Symptoms are markedly improved and she is no longer tachycardic.  She is agreeable to going home and returning for worsening symptoms.   Ulice Dash, MD    Note: This note was generated in part or whole with voice recognition software. Voice recognition is usually quite accurate but there are transcription errors that can and very often do occur. I apologize for any typographical errors that were not detected and corrected.     Emily Filbert, MD 04/05/19 2025    Emily Filbert, MD 04/05/19 2212

## 2019-04-06 LAB — POCT PREGNANCY, URINE: Preg Test, Ur: NEGATIVE

## 2019-04-08 LAB — URINE CULTURE: Culture: >=100000 — AB

## 2019-04-13 ENCOUNTER — Emergency Department: Payer: Medicaid Other

## 2019-04-13 ENCOUNTER — Other Ambulatory Visit: Payer: Self-pay

## 2019-04-13 ENCOUNTER — Emergency Department
Admission: EM | Admit: 2019-04-13 | Discharge: 2019-04-13 | Disposition: A | Discharge status: Home / Self Care | Payer: Medicaid Other | Attending: Emergency Medicine | Admitting: Emergency Medicine

## 2019-04-13 DIAGNOSIS — Y999 Unspecified external cause status: Secondary | ICD-10-CM | POA: Diagnosis not present

## 2019-04-13 DIAGNOSIS — S0083XA Contusion of other part of head, initial encounter: Secondary | ICD-10-CM | POA: Diagnosis not present

## 2019-04-13 DIAGNOSIS — R040 Epistaxis: Secondary | ICD-10-CM | POA: Diagnosis not present

## 2019-04-13 DIAGNOSIS — Z79899 Other long term (current) drug therapy: Secondary | ICD-10-CM | POA: Insufficient documentation

## 2019-04-13 DIAGNOSIS — Y939 Activity, unspecified: Secondary | ICD-10-CM | POA: Diagnosis not present

## 2019-04-13 DIAGNOSIS — Y929 Unspecified place or not applicable: Secondary | ICD-10-CM | POA: Insufficient documentation

## 2019-04-13 DIAGNOSIS — S0993XA Unspecified injury of face, initial encounter: Secondary | ICD-10-CM | POA: Diagnosis present

## 2019-04-13 LAB — POCT PREGNANCY, URINE: Preg Test, Ur: NEGATIVE

## 2019-04-13 MED ORDER — IBUPROFEN 600 MG PO TABS: 600.0000 mg | ORAL_TABLET | Freq: Four times a day (QID) | ORAL | 0 refills | Status: DC | PRN

## 2019-04-13 MED ORDER — TRAMADOL HCL 50 MG PO TABS: 50.0000 mg | ORAL_TABLET | Freq: Four times a day (QID) | ORAL | 0 refills | Status: DC | PRN

## 2019-04-13 NOTE — Discharge Instructions (Addendum)
Please apply ice to the right jaw and nose 20 minutes every hour for the next couple days to help with swelling.  You may take Tylenol and ibuprofen as needed for mild to moderate pain.  Use tramadol as needed for severe pain.  Eat a soft diet.  If any headaches, nausea, vomiting or worsening pain return to the ER.
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
## 2019-05-13 ENCOUNTER — Encounter: Payer: Self-pay | Admitting: Emergency Medicine

## 2019-05-13 ENCOUNTER — Emergency Department
Admission: EM | Admit: 2019-05-13 | Discharge: 2019-05-13 | Disposition: A | Discharge status: Home / Self Care | Payer: Medicaid Other | Attending: Emergency Medicine | Admitting: Emergency Medicine

## 2019-05-13 ENCOUNTER — Emergency Department: Payer: Medicaid Other

## 2019-05-13 DIAGNOSIS — R519 Headache, unspecified: Secondary | ICD-10-CM | POA: Diagnosis not present

## 2019-05-13 DIAGNOSIS — Y999 Unspecified external cause status: Secondary | ICD-10-CM | POA: Diagnosis not present

## 2019-05-13 DIAGNOSIS — T7611XA Adult physical abuse, suspected, initial encounter: Secondary | ICD-10-CM | POA: Diagnosis not present

## 2019-05-13 DIAGNOSIS — H5711 Ocular pain, right eye: Secondary | ICD-10-CM | POA: Insufficient documentation

## 2019-05-13 DIAGNOSIS — Y9389 Activity, other specified: Secondary | ICD-10-CM | POA: Diagnosis not present

## 2019-05-13 DIAGNOSIS — Y929 Unspecified place or not applicable: Secondary | ICD-10-CM | POA: Insufficient documentation

## 2019-05-13 DIAGNOSIS — H05231 Hemorrhage of right orbit: Secondary | ICD-10-CM

## 2019-05-13 DIAGNOSIS — S01111A Laceration without foreign body of right eyelid and periocular area, initial encounter: Secondary | ICD-10-CM

## 2019-05-13 LAB — POCT PREGNANCY, URINE: Preg Test, Ur: NEGATIVE

## 2019-05-13 MED ORDER — BACITRACIN-POLYMYXIN B 500-10000 UNIT/GM OP OINT: 1.0000 "application " | TOPICAL_OINTMENT | Freq: Two times a day (BID) | OPHTHALMIC | 0 refills | Status: AC

## 2019-05-13 MED ORDER — TETRACAINE HCL 0.5 % OP SOLN
2.0000 [drp] | Freq: Once | OPHTHALMIC | Status: AC
  Administered 2019-05-13: 2 [drp] via OPHTHALMIC

## 2019-05-13 MED ORDER — LIDOCAINE HCL 2 % IJ SOLN: 10.0000 mL | Freq: Once | INTRAMUSCULAR | Status: DC

## 2019-05-13 MED ORDER — OXYCODONE-ACETAMINOPHEN 5-325 MG PO TABS
1.0000 | ORAL_TABLET | Freq: Once | ORAL | Status: AC
  Administered 2019-05-13: 1 via ORAL
  Filled 2019-05-13: qty 1

## 2019-05-13 MED ORDER — LIDOCAINE-EPINEPHRINE 2 %-1:100000 IJ SOLN
20.0000 mL | Freq: Once | INTRAMUSCULAR | Status: AC
  Administered 2019-05-13: 20 mL via INTRADERMAL

## 2019-05-13 MED ORDER — OXYCODONE-ACETAMINOPHEN 5-325 MG PO TABS: 1.0000 | ORAL_TABLET | ORAL | 0 refills | Status: DC | PRN

## 2019-05-13 NOTE — ED Notes (Signed)
Pt awaiting transport via [facility] at this time

## 2019-05-13 NOTE — Discharge Instructions (Addendum)
Apply ice compresses several times a day for the next 3 days. Apply bacitracin over the laceration twice a day. Keep it dry and clean. It is okay to wash your face with warm water and soap. Follow up with Dr. Inez Pilgrim in 1 week. Return to the ER for worsening pain, swelling, changes in vision, loss of vision, severe headache, or any signs of infection of the laceration which include redness, warmth, pus, or fever.
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
## 2019-05-13 NOTE — Progress Notes (Signed)
Patient ID: Chirstine Defrain, female   DOB: May 05, 2000, 19 y.o.   MRN: [id number] PROCEDURE: Repair of lid margin laceration RUL  Surgeon: [place] Trenika Hudson, MD in Emergency Dept  Compl: none  Anesth: 2% lidopcaine with epi  Descr: The eye was prepped using 5% betadine and draped.  The lid was inspected.  The margin was lacerated in two locations with a less than 54mm tag of lid margin free from either side. I decided to excise this and repair as a wedge resection rather than attempt to save the 66mm tag of tissue.  The tarsus was split through the vertical extent.  A 6-0 vicryl suture was placed through the tarsus to align the lid margin.  A second suture was placed through the grey line and a third through the lash line.  This approximated the lid margin.  The suture ends of these were left long and tied under the superior skin sutures x2 to evert the lid margin.  Bacitracin was applied to the skin surface and ocular surface.

## 2019-05-13 NOTE — ED Notes (Signed)
Per [employer] they are en route
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
## 2020-02-23 ENCOUNTER — Ambulatory Visit
Admission: EM | Admit: 2020-02-23 | Discharge: 2020-02-23 | Disposition: A | Discharge status: Home / Self Care | Payer: Medicaid Other | Attending: Family Medicine | Admitting: Family Medicine

## 2020-02-23 ENCOUNTER — Encounter: Payer: Self-pay | Admitting: Emergency Medicine

## 2020-02-23 ENCOUNTER — Other Ambulatory Visit: Payer: Self-pay

## 2020-02-23 DIAGNOSIS — R059 Cough, unspecified: Secondary | ICD-10-CM | POA: Diagnosis not present

## 2020-02-23 DIAGNOSIS — Z20822 Contact with and (suspected) exposure to covid-19: Secondary | ICD-10-CM

## 2020-02-23 DIAGNOSIS — U071 COVID-19: Secondary | ICD-10-CM

## 2020-02-23 DIAGNOSIS — M791 Myalgia, unspecified site: Secondary | ICD-10-CM | POA: Diagnosis not present

## 2020-02-23 LAB — RESP PANEL BY RT-PCR (FLU A&B, COVID) ARPGX2
Influenza A by PCR: NEGATIVE
Influenza B by PCR: NEGATIVE
SARS Coronavirus 2 by RT PCR: POSITIVE — AB

## 2020-02-23 MED ORDER — BENZONATATE 100 MG PO CAPS: 200.0000 mg | ORAL_CAPSULE | Freq: Three times a day (TID) | ORAL | 0 refills | Status: AC | PRN

## 2020-02-23 MED ORDER — ACETAMINOPHEN 500 MG PO TABS
1000.0000 mg | ORAL_TABLET | Freq: Once | ORAL | Status: AC
  Administered 2020-02-23: 1000 mg via ORAL

## 2020-02-23 MED ORDER — IBUPROFEN 800 MG PO TABS: 800.0000 mg | ORAL_TABLET | Freq: Three times a day (TID) | ORAL | 0 refills | Status: AC | PRN

## 2020-02-23 NOTE — ED Provider Notes (Signed)
[facility]    CSN: [id number] Arrival date & time: 02/23/20  1000      History   Chief Complaint No chief complaint on file.   HPI Abigail Cortez is a 19 y.o. female presenting for fatigue, body aches occasional, reviewed and nasal congestion starting today.  Patient says she has felt feverish but no recorded temperature.  Patient states that her sister is COVID-19.  Patient has not been vaccinated for COVID-19.  Medical history significant for sickle cell trait but no other medical problems.  Patient has not taken any medication for symptoms.  She says that her whole body hurts.  She denies any chest pain or breathing difficulty.  Denies any vomiting or diarrhea.  No other complaints or concerns.  HPI  Past Medical History:  Diagnosis Date  . Depression   . Medical history non-contributory   . Sickle-cell trait [facility](HCC)     Patient Active Problem List   Diagnosis Date Noted  . Pyelonephritis, acute 11/23/2017  . Irregular uterine contractions 03/07/2017    History reviewed. No pertinent surgical history.  OB History    Gravida  3   Para  3   Term  3   Preterm      AB      Living  3     SAB      IAB      Ectopic      Multiple  0   Live Births  3            Home Medications    Prior to Admission medications   Medication Sig Start Date End Date Taking? Authorizing Provider  benzonatate (TESSALON) 100 MG capsule Take 2 capsules (200 mg total) by mouth 3 (three) times daily as needed for up to 7 days for cough. 02/23/20 03/01/20 Yes Eusebio FriendlyEaves, Shakiera Edelson B, PA-C  ibuprofen (ADVIL) 800 MG tablet Take 1 tablet (800 mg total) by mouth every 8 (eight) hours as needed for up to 7 days for moderate pain. 02/23/20 03/01/20 Yes Eusebio FriendlyEaves, Elizeth Weinrich B, PA-C  sertraline (ZOLOFT) 25 MG tablet Take 25 mg by mouth daily.    [provider]    Family History Family History  Problem Relation Age of Onset  . Anemia Mother   . Asthma Mother   . Seizures  Mother   . Bipolar disorder Mother   . Hypertension Mother   . Hepatitis Mother   . Hypertension Maternal Grandmother   . Diabetes Maternal Grandmother   . Breast cancer Maternal Grandmother   . Bipolar disorder Maternal Grandmother   . Schizophrenia Maternal Grandmother   . Hypertension Maternal Grandfather   . Hypertension Paternal Grandmother   . Lung cancer Paternal Grandmother   . Diabetes Paternal Grandmother   . Migraines Paternal Grandmother   . Hypertension Paternal Grandfather     Social History Social History   Tobacco Use  . Smoking status: Never Smoker  . Smokeless tobacco: Never Used  Substance Use Topics  . Alcohol use: Not Currently  . Drug use: Yes    Types: Marijuana     Allergies   Patient has no known allergies.   Review of Systems Review of Systems  Constitutional: Positive for fatigue. Negative for chills, diaphoresis and fever.  HENT: Positive for congestion. Negative for ear pain, rhinorrhea, sinus pressure, sinus pain and sore throat.   Respiratory: Positive for cough. Negative for shortness of breath.   Gastrointestinal: Negative for abdominal pain, nausea and vomiting.  Musculoskeletal:  Positive for myalgias. Negative for arthralgias.  Skin: Negative for rash.  Neurological: Negative for weakness and headaches.  Hematological: Negative for adenopathy.     Physical Exam Triage Vital Signs ED Triage Vitals  Enc Vitals Group     BP 02/23/20 1116 (!) 122/93     Pulse Rate 02/23/20 1116 (!) 108     Resp 02/23/20 1116 18     Temp 02/23/20 1116 100.3 F (37.9 C)     Temp Source 02/23/20 1116 Oral     SpO2 02/23/20 1116 100 %     Weight --      Height 02/23/20 1115 5\' 3"  (1.6 m)     Head Circumference --      Peak Flow --      Pain Score 02/23/20 1115 0     Pain Loc --      Pain Edu? --      Excl. in GC? --    No data found.  Updated Vital Signs BP (!) 122/93 (BP Location: Right Arm)   Pulse (!) 108   Temp 100.3 F (37.9 C)  (Oral)   Resp 18   Ht 5\' 3"  (1.6 m)   LMP 02/02/2020   SpO2 100%   BMI 41.63 kg/m    Physical Exam Vitals and nursing note reviewed.  Constitutional:      General: She is not in acute distress.    Appearance: Normal appearance. She is obese. She is ill-appearing. She is not toxic-appearing.  HENT:     Head: Normocephalic and atraumatic.     Nose: Congestion and rhinorrhea present.     Mouth/Throat:     Mouth: Mucous membranes are moist.     Pharynx: Oropharynx is clear. Posterior oropharyngeal erythema present.  Eyes:     General: No scleral icterus.       Right eye: No discharge.        Left eye: No discharge.     Conjunctiva/sclera: Conjunctivae normal.  Cardiovascular:     Rate and Rhythm: Regular rhythm. Tachycardia present.     Heart sounds: Normal heart sounds.  Pulmonary:     Effort: Pulmonary effort is normal. No respiratory distress.     Breath sounds: Normal breath sounds.  Musculoskeletal:     Cervical back: Neck supple.  Skin:    General: Skin is dry.  Neurological:     General: No focal deficit present.     Mental Status: She is alert. Mental status is at baseline.     Motor: No weakness.     Gait: Gait normal.  Psychiatric:        Mood and Affect: Mood normal.        Behavior: Behavior normal.        Thought Content: Thought content normal.      UC Treatments / Results  Labs (all labs ordered are listed, but only abnormal results are displayed) Labs Reviewed  RESP PANEL BY RT-PCR (FLU A&B, COVID) ARPGX2 - Abnormal; Notable for the following components:      Result Value   SARS Coronavirus 2 by RT PCR POSITIVE (*)    All other components within normal limits    EKG   Radiology No results found.  Procedures Procedures (including critical care time)  Medications Ordered in UC Medications  acetaminophen (TYLENOL) tablet 1,000 mg (1,000 mg Oral Given 02/23/20 1242)    Initial Impression / Assessment and Plan / UC Course  I have  reviewed the triage vital  signs and the nursing notes.  Pertinent labs & imaging results that were available during my care of the patient were reviewed by me and considered in my medical decision making (see chart for details).   19 year old female presenting for cough, congestion, headaches and fatigue starting today.  Positive for Covid exposure.    Vital signs are stable.  Pulse is 108 bpm.  Other vital signs are normal.  Exam significant for ill-appearing female who is nontoxic.  Chest is clear to auscultation.  She has mild nasal congestion and rhinorrhea.  Respiratory panel obtained and positive for COVID-19.  CDC guidelines, isolation protocol and ED precautions reviewed with patient.  Sent Tessalon Perles to pharmacy as well as ibuprofen for her aches.  Patient was given Tylenol in the clinic for body aches.  Patient is unvaccinated and African-American so I forwarded her information to the infusion clinic to see if she is a candidate for MAB therapy.   Final Clinical Impressions(s) / UC Diagnoses   Final diagnoses:  COVID-19  Cough  Myalgia  Exposure to COVID-19 virus     Discharge Instructions     You have received COVID testing today either for positive exposure, concerning symptoms that could be related to COVID infection, screening purposes, or re-testing after confirmed positive.  Your test obtained today checks for active viral infection in the last 1-2 weeks. If your test is negative now, you can still test positive later. So, if you do develop symptoms you should either get re-tested and/or isolate x 10 days. Please follow CDC guidelines.  Your Covid test is positive.  You have to isolate 10 days from your symptom onset.  I will forward your information to the infusion clinic to see if you are a candidate for the monoclonal antibodies which can help your symptoms.  If symptomatic, go home and rest. Push fluids. Take Tylenol as needed for discomfort. Gargle warm salt  water. Throat lozenges. Take Mucinex DM or Robitussin for cough. Humidifier in bedroom to ease coughing. Warm showers. Also review the COVID handout for more information.  COVID-19 INFECTION: The incubation period of COVID-19 is approximately 14 days after exposure, with most symptoms developing in roughly 4-5 days. Symptoms may range in severity from mild to critically severe. Roughly 80% of those infected will have mild symptoms. People of any age may become infected with COVID-19 and have the ability to transmit the virus. The most common symptoms include: fever, fatigue, cough, body aches, headaches, sore throat, nasal congestion, shortness of breath, nausea, vomiting, diarrhea, changes in smell and/or taste.    COURSE OF ILLNESS Some patients may begin with mild disease which can progress quickly into critical symptoms. If your symptoms are worsening please call ahead to the Emergency Department and proceed there for further treatment. Recovery time appears to be roughly 1-2 weeks for mild symptoms and 3-6 weeks for severe disease.   GO IMMEDIATELY TO ER FOR FEVER YOU ARE UNABLE TO GET DOWN WITH TYLENOL, BREATHING PROBLEMS, CHEST PAIN, FATIGUE, LETHARGY, INABILITY TO EAT OR DRINK, ETC  QUARANTINE AND ISOLATION: To help decrease the spread of COVID-19 please remain isolated if you have COVID infection or are highly suspected to have COVID infection. This means -stay home and isolate to one room in the home if you live with others. Do not share a bed or bathroom with others while ill, sanitize and wipe down all countertops and keep common areas clean and disinfected. You may discontinue isolation if you have a mild  case and are asymptomatic 10 days after symptom onset as long as you have been fever free >24 hours without having to take Motrin or Tylenol. If your case is more severe (meaning you develop pneumonia or are admitted in the hospital), you may have to isolate longer.   If you have been in  close contact (within 6 feet) of someone diagnosed with COVID 19, you are advised to quarantine in your home for 14 days as symptoms can develop anywhere from 2-14 days after exposure to the virus. If you develop symptoms, you  must isolate.  Most current guidelines for COVID after exposure -isolate 10 days if you ARE NOT tested for COVID as long as symptoms do not develop -isolate 7 days if you are tested and remain asymptomatic -You do not necessarily need to be tested for COVID if you have + exposure and        develop   symptoms. Just isolate at home x10 days from symptom onset During this global pandemic, CDC advises to practice social distancing, try to stay at least 71ft away from others at all times. Wear a face covering. Wash and sanitize your hands regularly and avoid going anywhere that is not necessary.  KEEP IN MIND THAT THE COVID TEST IS NOT 100% ACCURATE AND YOU SHOULD STILL DO EVERYTHING TO PREVENT POTENTIAL SPREAD OF VIRUS TO OTHERS (WEAR MASK, WEAR GLOVES, WASH HANDS AND SANITIZE REGULARLY). IF INITIAL TEST IS NEGATIVE, THIS MAY NOT MEAN YOU ARE DEFINITELY NEGATIVE. MOST ACCURATE TESTING IS DONE 5-7 DAYS AFTER EXPOSURE.   It is not advised by CDC to get re-tested after receiving a positive COVID test since you can still test positive for weeks to months after you have already cleared the virus.   *If you have not been vaccinated for COVID, I strongly suggest you consider getting vaccinated as long as there are no contraindications.      ED Prescriptions    Medication Sig Dispense Auth. Provider   benzonatate (TESSALON) 100 MG capsule Take 2 capsules (200 mg total) by mouth 3 (three) times daily as needed for up to 7 days for cough. 21 capsule Eusebio Friendly B, PA-C   ibuprofen (ADVIL) 800 MG tablet Take 1 tablet (800 mg total) by mouth every 8 (eight) hours as needed for up to 7 days for moderate pain. 21 tablet Shirlee Latch, PA-C     PDMP not reviewed this encounter.    Shirlee Latch, PA-C 02/23/20 1250

## 2020-02-23 NOTE — ED Triage Notes (Signed)
Patient's sister tested positive for COVID. Patient c/o cough, nasal congestion, body aches that started this morning.

## 2020-02-23 NOTE — Discharge Instructions (Addendum)
You have received COVID testing today either for positive exposure, concerning symptoms that could be related to COVID infection, screening purposes, or re-testing after confirmed positive.  Your test obtained today checks for active viral infection in the last 1-2 weeks. If your test is negative now, you can still test positive later. So, if you do develop symptoms you should either get re-tested and/or isolate x 10 days. Please follow CDC guidelines.  Your Covid test is positive.  You have to isolate 10 days from your symptom onset.  I will forward your information to the infusion clinic to see if you are a candidate for the monoclonal antibodies which can help your symptoms.  If symptomatic, go home and rest. Push fluids. Take Tylenol as needed for discomfort. Gargle warm salt water. Throat lozenges. Take Mucinex DM or Robitussin for cough. Humidifier in bedroom to ease coughing. Warm showers. Also review the COVID handout for more information.  COVID-19 INFECTION: The incubation period of COVID-19 is approximately 14 days after exposure, with most symptoms developing in roughly 4-5 days. Symptoms may range in severity from mild to critically severe. Roughly 80% of those infected will have mild symptoms. People of any age may become infected with COVID-19 and have the ability to transmit the virus. The most common symptoms include: fever, fatigue, cough, body aches, headaches, sore throat, nasal congestion, shortness of breath, nausea, vomiting, diarrhea, changes in smell and/or taste.    COURSE OF ILLNESS Some patients may begin with mild disease which can progress quickly into critical symptoms. If your symptoms are worsening please call ahead to the Emergency Department and proceed there for further treatment. Recovery time appears to be roughly 1-2 weeks for mild symptoms and 3-6 weeks for severe disease.   GO IMMEDIATELY TO ER FOR FEVER YOU ARE UNABLE TO GET DOWN WITH TYLENOL, BREATHING PROBLEMS,  CHEST PAIN, FATIGUE, LETHARGY, INABILITY TO EAT OR DRINK, ETC  QUARANTINE AND ISOLATION: To help decrease the spread of COVID-19 please remain isolated if you have COVID infection or are highly suspected to have COVID infection. This means -stay home and isolate to one room in the home if you live with others. Do not share a bed or bathroom with others while ill, sanitize and wipe down all countertops and keep common areas clean and disinfected. You may discontinue isolation if you have a mild case and are asymptomatic 10 days after symptom onset as long as you have been fever free >24 hours without having to take Motrin or Tylenol. If your case is more severe (meaning you develop pneumonia or are admitted in the hospital), you may have to isolate longer.   If you have been in close contact (within 6 feet) of someone diagnosed with COVID 19, you are advised to quarantine in your home for 14 days as symptoms can develop anywhere from 2-14 days after exposure to the virus. If you develop symptoms, you  must isolate.  Most current guidelines for COVID after exposure -isolate 10 days if you ARE NOT tested for COVID as long as symptoms do not develop -isolate 7 days if you are tested and remain asymptomatic -You do not necessarily need to be tested for COVID if you have + exposure and        develop   symptoms. Just isolate at home x10 days from symptom onset During this global pandemic, CDC advises to practice social distancing, try to stay at least 24ft away from others at all times. Wear a face covering.  Wash and sanitize your hands regularly and avoid going anywhere that is not necessary.  KEEP IN MIND THAT THE COVID TEST IS NOT 100% ACCURATE AND YOU SHOULD STILL DO EVERYTHING TO PREVENT POTENTIAL SPREAD OF VIRUS TO OTHERS (WEAR MASK, WEAR GLOVES, WASH HANDS AND SANITIZE REGULARLY). IF INITIAL TEST IS NEGATIVE, THIS MAY NOT MEAN YOU ARE DEFINITELY NEGATIVE. MOST ACCURATE TESTING IS DONE 5-7 DAYS AFTER  EXPOSURE.   It is not advised by CDC to get re-tested after receiving a positive COVID test since you can still test positive for weeks to months after you have already cleared the virus.   *If you have not been vaccinated for COVID, I strongly suggest you consider getting vaccinated as long as there are no contraindications.

## 2020-12-11 ENCOUNTER — Other Ambulatory Visit: Payer: Self-pay

## 2020-12-11 ENCOUNTER — Emergency Department
Admission: EM | Admit: 2020-12-11 | Discharge: 2020-12-11 | Disposition: A | Discharge status: Home / Self Care | Payer: Medicaid Other | Attending: Student in an Organized Health Care Education/Training Program | Admitting: Student in an Organized Health Care Education/Training Program

## 2020-12-11 DIAGNOSIS — R070 Pain in throat: Secondary | ICD-10-CM | POA: Diagnosis present

## 2020-12-11 DIAGNOSIS — J020 Streptococcal pharyngitis: Secondary | ICD-10-CM | POA: Insufficient documentation

## 2020-12-11 LAB — GROUP A STREP BY PCR: Group A Strep by PCR: DETECTED — AB

## 2020-12-11 MED ORDER — PENICILLIN V POTASSIUM 500 MG PO TABS: 500.0000 mg | ORAL_TABLET | Freq: Two times a day (BID) | ORAL | 0 refills | Status: AC

## 2020-12-11 MED ORDER — DEXAMETHASONE 10 MG/ML FOR PEDIATRIC ORAL USE
10.0000 mg | Freq: Once | INTRAMUSCULAR | Status: AC
  Administered 2020-12-11: 10 mg via ORAL
  Filled 2020-12-11: qty 1

## 2020-12-11 MED ORDER — KETOROLAC TROMETHAMINE 30 MG/ML IJ SOLN
15.0000 mg | Freq: Once | INTRAMUSCULAR | Status: AC
  Administered 2020-12-11: 15 mg via INTRAMUSCULAR
  Filled 2020-12-11: qty 1

## 2020-12-11 MED ORDER — PENICILLIN V POTASSIUM 500 MG PO TABS
500.0000 mg | ORAL_TABLET | Freq: Once | ORAL | Status: AC
  Administered 2020-12-11: 500 mg via ORAL
  Filled 2020-12-11: qty 1

## 2020-12-11 NOTE — ED Triage Notes (Signed)
Pt comes with c/o sore throat for few days. Pt state pain when swallowing. Pt denies any fevers.

## 2020-12-11 NOTE — Discharge Instructions (Addendum)
Please take the antibiotic twice daily for the next 10 days.

## 2020-12-11 NOTE — ED Provider Notes (Signed)
[facility]  ____________________________________________   Event Date/Time   First MD Initiated Contact with Patient 12/11/20 1131     (approximate)  I have reviewed the triage vital signs and the nursing notes.   HISTORY  Chief Complaint Sore Throat    HPI Abigail Cortez is a 20 y.o. female with past medical history depression, sickle cell trait who presents with sore throat.  Symptoms started 2 to 3 days ago.  She endorses bilateral throat pain.  Has still been tolerating p.o. but has pain with swallowing.  No voice changes.  She denies fevers or chills.  Has had mild cough and congestion.  No history of strep infections in the past.  Denies difficulty breathing.         Past Medical History:  Diagnosis Date   Depression    Medical history non-contributory    Sickle-cell trait [facility])     Patient Active Problem List   Diagnosis Date Noted   Pyelonephritis, acute 11/23/2017   Irregular uterine contractions 03/07/2017    History reviewed. No pertinent surgical history.  Prior to Admission medications   Medication Sig Start Date End Date Taking? Authorizing Provider  penicillin v potassium (VEETID) 500 MG tablet Take 1 tablet (500 mg total) by mouth in the morning and at bedtime for 10 days. 12/11/20 12/21/20 Yes Georga Hacking, MD  sertraline (ZOLOFT) 25 MG tablet Take 25 mg by mouth daily.    [provider]    Allergies Patient has no known allergies.  Family History  Problem Relation Age of Onset   Anemia Mother    Asthma Mother    Seizures Mother    Bipolar disorder Mother    Hypertension Mother    Hepatitis Mother    Hypertension Maternal Grandmother    Diabetes Maternal Grandmother    Breast cancer Maternal Grandmother    Bipolar disorder Maternal Grandmother    Schizophrenia Maternal Grandmother    Hypertension Maternal Grandfather    Hypertension Paternal Grandmother    Lung cancer Paternal Grandmother     Diabetes Paternal Grandmother    Migraines Paternal Grandmother    Hypertension Paternal Grandfather     Social History Social History   Tobacco Use   Smoking status: Never   Smokeless tobacco: Never  Substance Use Topics   Alcohol use: Not Currently   Drug use: Yes    Types: Marijuana    Review of Systems   Review of Systems  Constitutional:  Negative for chills and fever.  HENT:  Positive for congestion and sore throat. Negative for trouble swallowing and voice change.   Respiratory:  Negative for shortness of breath.   All other systems reviewed and are negative.  Physical Exam Updated Vital Signs BP (!) 145/110   Pulse 98   Temp 98 F (36.7 C)   Resp 18   Ht 5\' 3"  (1.6 m)   Wt 106.6 kg   SpO2 97%   BMI 41.63 kg/m   Physical Exam Vitals and nursing note reviewed.  Constitutional:      General: She is not in acute distress.    Appearance: Normal appearance.  HENT:     Head: Normocephalic and atraumatic.     Mouth/Throat:     Comments: Erythema of the bilateral posterior oropharynx, no exudate, uvula is midline No stridor Tolerating secretions Eyes:     General: No scleral icterus.    Conjunctiva/sclera: Conjunctivae normal.  Pulmonary:     Effort: Pulmonary effort is  normal. No respiratory distress.     Breath sounds: No stridor.  Musculoskeletal:        General: No deformity or signs of injury.     Cervical back: Normal range of motion.  Skin:    General: Skin is dry.     Coloration: Skin is not jaundiced or pale.  Neurological:     General: No focal deficit present.     Mental Status: She is alert and oriented to person, place, and time. Mental status is at baseline.  Psychiatric:        Mood and Affect: Mood normal.        Behavior: Behavior normal.     LABS (all labs ordered are listed, but only abnormal results are displayed)  Labs Reviewed  GROUP A STREP BY PCR - Abnormal; Notable for the following components:      Result Value    Group A Strep by PCR DETECTED (*)    All other components within normal limits   ____________________________________________  EKG  N/a ____________________________________________  RADIOLOGY Ky Barban, personally viewed and evaluated these images (plain radiographs) as part of my medical decision making, as well as reviewing the written report by the radiologist.  ED MD interpretation:  n/a    ____________________________________________   PROCEDURES  Procedure(s) performed (including Critical Care):  Procedures   ____________________________________________   INITIAL IMPRESSION / ASSESSMENT AND PLAN / ED COURSE     Patient is a 20 year old female who presents with pharyngitis.  Has had 2 to 3 days of sore throat with pain with swallowing but no difficulty swallowing, still tolerating p.o., no voice changes or difficulty breathing.  On exam she is very well-appearing.  No stridor, tolerating secretions without voice change.  Plain bilaterally without exudate and uvula is midline.  Patient was given a dose of dexamethasone and Toradol.  Strep test is positive so we will treat with 10 days of penicillin.  Clinical Course as of 12/11/20 1314  Mon Dec 11, 2020  1310 Group A Strep by PCR(!): DETECTED [KM]    Clinical Course User Index [KM] Georga Hacking, MD     ____________________________________________   FINAL CLINICAL IMPRESSION(S) / ED DIAGNOSES  Final diagnoses:  Strep pharyngitis     ED Discharge Orders          Ordered    penicillin v potassium (VEETID) 500 MG tablet  2 times daily        12/11/20 1314             Note:  This document was prepared using Dragon voice recognition software and may include unintentional dictation errors.    Georga Hacking, MD 12/11/20 1316
